# Patient Record
Sex: Female | Born: 1935 | Race: White | Hispanic: No
Health system: Southern US, Community
[De-identification: ages and names within clinical notes are randomized; demographics above are authoritative.]

---

## 2006-10-29 ENCOUNTER — Ambulatory Visit: Payer: Self-pay | Admitting: Ophthalmology

## 2010-01-03 ENCOUNTER — Ambulatory Visit: Payer: Self-pay | Admitting: Anesthesiology

## 2010-01-15 ENCOUNTER — Ambulatory Visit: Payer: Self-pay | Admitting: Anesthesiology

## 2010-02-03 ENCOUNTER — Ambulatory Visit: Payer: Self-pay | Admitting: Unknown Physician Specialty

## 2010-07-01 ENCOUNTER — Ambulatory Visit: Payer: Self-pay | Admitting: Oncology

## 2010-07-01 ENCOUNTER — Inpatient Hospital Stay: Payer: Self-pay | Admitting: Internal Medicine

## 2010-07-13 ENCOUNTER — Other Ambulatory Visit: Payer: Self-pay | Admitting: Unknown Physician Specialty

## 2010-08-13 ENCOUNTER — Ambulatory Visit: Payer: Self-pay | Admitting: Oncology

## 2010-08-14 ENCOUNTER — Ambulatory Visit: Payer: Self-pay | Admitting: Oncology

## 2010-08-31 ENCOUNTER — Ambulatory Visit: Payer: Self-pay | Admitting: Oncology

## 2010-09-13 ENCOUNTER — Observation Stay: Payer: Self-pay | Admitting: Specialist

## 2011-04-02 ENCOUNTER — Ambulatory Visit: Payer: Self-pay | Admitting: Internal Medicine

## 2011-04-14 ENCOUNTER — Emergency Department: Payer: Self-pay | Admitting: Internal Medicine

## 2011-04-14 LAB — URINALYSIS, COMPLETE
Bilirubin,UR: NEGATIVE
Blood: NEGATIVE
Glucose,UR: NEGATIVE mg/dL (ref 0–75)
Ketone: NEGATIVE
Specific Gravity: 1.014 (ref 1.003–1.030)
Squamous Epithelial: NONE SEEN
WBC UR: 30 /HPF (ref 0–5)

## 2011-04-14 LAB — COMPREHENSIVE METABOLIC PANEL
Albumin: 3.4 g/dL (ref 3.4–5.0)
Anion Gap: 9 (ref 7–16)
Bilirubin,Total: 0.3 mg/dL (ref 0.2–1.0)
Calcium, Total: 9.1 mg/dL (ref 8.5–10.1)
Chloride: 103 mmol/L (ref 98–107)
Creatinine: 0.86 mg/dL (ref 0.60–1.30)
Osmolality: 284 (ref 275–301)
SGOT(AST): 18 U/L (ref 15–37)
Total Protein: 7.7 g/dL (ref 6.4–8.2)

## 2011-04-14 LAB — TROPONIN I: Troponin-I: <0.02 ng/mL

## 2011-04-14 LAB — CBC
HCT: 32.5 % — ABNORMAL LOW (ref 35.0–47.0)
HGB: 10.9 g/dL — ABNORMAL LOW (ref 12.0–16.0)
MCH: 31.6 pg (ref 26.0–34.0)
MCHC: 33.4 g/dL (ref 32.0–36.0)
MCV: 95 fL (ref 80–100)
Platelet: 257 10*3/uL (ref 150–440)
WBC: 7.2 10*3/uL (ref 3.6–11.0)

## 2011-04-15 ENCOUNTER — Inpatient Hospital Stay: Payer: Self-pay | Admitting: Specialist

## 2011-04-15 LAB — CBC
HCT: 32.2 % — ABNORMAL LOW (ref 35.0–47.0)
HGB: 10.7 g/dL — ABNORMAL LOW (ref 12.0–16.0)
MCH: 31.7 pg (ref 26.0–34.0)
MCHC: 33.3 g/dL (ref 32.0–36.0)
RDW: 13.5 % (ref 11.5–14.5)
WBC: 17.4 10*3/uL — ABNORMAL HIGH (ref 3.6–11.0)

## 2011-04-15 LAB — COMPREHENSIVE METABOLIC PANEL
Anion Gap: 10 (ref 7–16)
BUN: 23 mg/dL — ABNORMAL HIGH (ref 7–18)
Bilirubin,Total: 0.3 mg/dL (ref 0.2–1.0)
Chloride: 100 mmol/L (ref 98–107)
Co2: 30 mmol/L (ref 21–32)
Creatinine: 0.91 mg/dL (ref 0.60–1.30)
Potassium: 4.4 mmol/L (ref 3.5–5.1)
SGOT(AST): 26 U/L (ref 15–37)
Total Protein: 7.9 g/dL (ref 6.4–8.2)

## 2011-04-15 LAB — PROTIME-INR
INR: 1
Prothrombin Time: 13.8 secs (ref 11.5–14.7)

## 2011-04-17 LAB — URINE CULTURE

## 2011-04-18 LAB — HEMATOCRIT: HCT: 26.1 % — ABNORMAL LOW (ref 35.0–47.0)

## 2011-04-18 LAB — ELECTROLYTE PANEL
Anion Gap: 13 (ref 7–16)
Chloride: 100 mmol/L (ref 98–107)
Co2: 25 mmol/L (ref 21–32)

## 2011-04-19 LAB — CREATININE, SERUM
EGFR (African American): >60
EGFR (Non-African Amer.): >60

## 2011-04-19 LAB — PATHOLOGY REPORT

## 2011-04-19 LAB — OCCULT BLOOD X 1 CARD TO LAB, STOOL: Occult Blood, Feces: NEGATIVE

## 2011-04-21 LAB — BASIC METABOLIC PANEL
Calcium, Total: 8.4 mg/dL — ABNORMAL LOW (ref 8.5–10.1)
EGFR (African American): >60
EGFR (Non-African Amer.): >60
Glucose: 101 mg/dL — ABNORMAL HIGH (ref 65–99)
Osmolality: 272 (ref 275–301)
Sodium: 136 mmol/L (ref 136–145)

## 2011-04-21 LAB — CBC WITH DIFFERENTIAL/PLATELET
Basophil #: 0.1 10*3/uL (ref 0.0–0.1)
Eosinophil #: 0.5 10*3/uL (ref 0.0–0.7)
HGB: 9 g/dL — ABNORMAL LOW (ref 12.0–16.0)
Lymphocyte %: 18.6 %
MCHC: 34.1 g/dL (ref 32.0–36.0)
Monocyte %: 14 %
Neutrophil %: 61.1 %
Platelet: 260 10*3/uL (ref 150–440)
RBC: 2.82 10*6/uL — ABNORMAL LOW (ref 3.80–5.20)
RDW: 14.2 % (ref 11.5–14.5)

## 2011-04-22 ENCOUNTER — Encounter: Payer: Self-pay | Admitting: Internal Medicine

## 2011-05-03 ENCOUNTER — Encounter: Payer: Self-pay | Admitting: Internal Medicine

## 2011-05-03 ENCOUNTER — Ambulatory Visit: Payer: Self-pay | Admitting: Internal Medicine

## 2011-05-31 ENCOUNTER — Encounter: Payer: Self-pay | Admitting: Internal Medicine

## 2011-07-01 ENCOUNTER — Encounter: Payer: Self-pay | Admitting: Internal Medicine

## 2011-07-31 ENCOUNTER — Encounter: Payer: Self-pay | Admitting: Internal Medicine

## 2011-12-07 ENCOUNTER — Inpatient Hospital Stay: Payer: Self-pay | Admitting: Surgery

## 2011-12-07 LAB — CBC
HCT: 37 % (ref 35.0–47.0)
HGB: 12.6 g/dL (ref 12.0–16.0)
MCH: 31.5 pg (ref 26.0–34.0)
MCHC: 34 g/dL (ref 32.0–36.0)
MCV: 93 fL (ref 80–100)
RDW: 13 % (ref 11.5–14.5)

## 2011-12-07 LAB — COMPREHENSIVE METABOLIC PANEL
Albumin: 3.3 g/dL — ABNORMAL LOW (ref 3.4–5.0)
BUN: 17 mg/dL (ref 7–18)
Bilirubin,Total: 0.8 mg/dL (ref 0.2–1.0)
Chloride: 98 mmol/L (ref 98–107)
Co2: 25 mmol/L (ref 21–32)
Creatinine: 0.84 mg/dL (ref 0.60–1.30)
EGFR (African American): >60
Glucose: 112 mg/dL — ABNORMAL HIGH (ref 65–99)
SGOT(AST): 33 U/L (ref 15–37)
SGPT (ALT): 32 U/L (ref 12–78)
Total Protein: 8.1 g/dL (ref 6.4–8.2)

## 2011-12-07 LAB — URINALYSIS, COMPLETE
Bacteria: NONE SEEN
Bilirubin,UR: NEGATIVE
Glucose,UR: NEGATIVE mg/dL (ref 0–75)
Nitrite: NEGATIVE
Protein: NEGATIVE
Specific Gravity: 1.021 (ref 1.003–1.030)
WBC UR: 28 /HPF (ref 0–5)

## 2011-12-07 LAB — TROPONIN I: Troponin-I: <0.02 ng/mL

## 2011-12-08 LAB — BASIC METABOLIC PANEL
Calcium, Total: 8.2 mg/dL — ABNORMAL LOW (ref 8.5–10.1)
Chloride: 101 mmol/L (ref 98–107)
Co2: 26 mmol/L (ref 21–32)
Creatinine: 0.74 mg/dL (ref 0.60–1.30)
EGFR (African American): >60
Glucose: 105 mg/dL — ABNORMAL HIGH (ref 65–99)
Potassium: 4.4 mmol/L (ref 3.5–5.1)
Sodium: 134 mmol/L — ABNORMAL LOW (ref 136–145)

## 2011-12-08 LAB — CBC WITH DIFFERENTIAL/PLATELET
Basophil %: 1.3 %
Eosinophil #: 0.2 10*3/uL (ref 0.0–0.7)
Eosinophil %: 2.5 %
HCT: 32.2 % — ABNORMAL LOW (ref 35.0–47.0)
HGB: 10.7 g/dL — ABNORMAL LOW (ref 12.0–16.0)
Lymphocyte #: 1.9 10*3/uL (ref 1.0–3.6)
Lymphocyte %: 23.3 %
MCH: 30.9 pg (ref 26.0–34.0)
MCV: 93 fL (ref 80–100)
Monocyte %: 15.8 %
Neutrophil #: 4.8 10*3/uL (ref 1.4–6.5)
RBC: 3.45 10*6/uL — ABNORMAL LOW (ref 3.80–5.20)
WBC: 8.3 10*3/uL (ref 3.6–11.0)

## 2011-12-09 LAB — BASIC METABOLIC PANEL
Anion Gap: 9 (ref 7–16)
Calcium, Total: 8.3 mg/dL — ABNORMAL LOW (ref 8.5–10.1)
Chloride: 101 mmol/L (ref 98–107)
Co2: 23 mmol/L (ref 21–32)
Creatinine: 0.68 mg/dL (ref 0.60–1.30)
EGFR (African American): >60
EGFR (Non-African Amer.): >60
Glucose: 159 mg/dL — ABNORMAL HIGH (ref 65–99)
Osmolality: 271 (ref 275–301)
Potassium: 4.6 mmol/L (ref 3.5–5.1)
Sodium: 133 mmol/L — ABNORMAL LOW (ref 136–145)

## 2011-12-09 LAB — CLOSTRIDIUM DIFFICILE BY PCR

## 2011-12-09 LAB — CBC WITH DIFFERENTIAL/PLATELET
Eosinophil #: 0 10*3/uL (ref 0.0–0.7)
Eosinophil %: 0 %
HCT: 33.4 % — ABNORMAL LOW (ref 35.0–47.0)
Lymphocyte %: 3.9 %
MCV: 94 fL (ref 80–100)
Monocyte %: 6.5 %
Neutrophil #: 14.9 10*3/uL — ABNORMAL HIGH (ref 1.4–6.5)
Neutrophil %: 89.5 %
Platelet: 312 10*3/uL (ref 150–440)
RBC: 3.54 10*6/uL — ABNORMAL LOW (ref 3.80–5.20)
RDW: 13.4 % (ref 11.5–14.5)
WBC: 16.7 10*3/uL — ABNORMAL HIGH (ref 3.6–11.0)

## 2011-12-10 LAB — BASIC METABOLIC PANEL
Calcium, Total: 7.9 mg/dL — ABNORMAL LOW (ref 8.5–10.1)
Creatinine: 1.02 mg/dL (ref 0.60–1.30)
Glucose: 143 mg/dL — ABNORMAL HIGH (ref 65–99)
Osmolality: 284 (ref 275–301)
Potassium: 4.2 mmol/L (ref 3.5–5.1)
Sodium: 140 mmol/L (ref 136–145)

## 2011-12-10 LAB — CBC WITH DIFFERENTIAL/PLATELET
Basophil #: 0 10*3/uL (ref 0.0–0.1)
Basophil %: 0.2 %
Eosinophil #: 0.1 10*3/uL (ref 0.0–0.7)
Eosinophil %: 0.5 %
HGB: 8.2 g/dL — ABNORMAL LOW (ref 12.0–16.0)
Lymphocyte #: 1.1 10*3/uL (ref 1.0–3.6)
MCV: 93 fL (ref 80–100)
Monocyte %: 11.7 %
Neutrophil %: 76.1 %
Platelet: 237 10*3/uL (ref 150–440)
RBC: 2.58 10*6/uL — ABNORMAL LOW (ref 3.80–5.20)
RDW: 13.6 % (ref 11.5–14.5)
WBC: 9.4 10*3/uL (ref 3.6–11.0)

## 2011-12-11 LAB — CBC WITH DIFFERENTIAL/PLATELET
Basophil %: 0.6 %
Eosinophil %: 2.2 %
HCT: 25.3 % — ABNORMAL LOW (ref 35.0–47.0)
HGB: 8.4 g/dL — ABNORMAL LOW (ref 12.0–16.0)
Lymphocyte #: 1.3 10*3/uL (ref 1.0–3.6)
Lymphocyte %: 15.6 %
MCV: 94 fL (ref 80–100)
Monocyte %: 7.5 %
Platelet: 275 10*3/uL (ref 150–440)
RBC: 2.68 10*6/uL — ABNORMAL LOW (ref 3.80–5.20)
WBC: 8.4 10*3/uL (ref 3.6–11.0)

## 2011-12-12 LAB — BASIC METABOLIC PANEL
BUN: 6 mg/dL — ABNORMAL LOW (ref 7–18)
Calcium, Total: 8.3 mg/dL — ABNORMAL LOW (ref 8.5–10.1)
Co2: 25 mmol/L (ref 21–32)
Creatinine: 0.52 mg/dL — ABNORMAL LOW (ref 0.60–1.30)
Glucose: 124 mg/dL — ABNORMAL HIGH (ref 65–99)

## 2012-01-03 ENCOUNTER — Emergency Department: Payer: Self-pay | Admitting: Unknown Physician Specialty

## 2012-01-03 LAB — URINALYSIS, COMPLETE
Bacteria: NONE SEEN
Bilirubin,UR: NEGATIVE
Blood: NEGATIVE
Glucose,UR: NEGATIVE mg/dL (ref 0–75)
Ketone: NEGATIVE
Leukocyte Esterase: NEGATIVE
Ph: 6 (ref 4.5–8.0)
Specific Gravity: 1.005 (ref 1.003–1.030)
Squamous Epithelial: 1

## 2012-01-03 LAB — COMPREHENSIVE METABOLIC PANEL
Albumin: 3 g/dL — ABNORMAL LOW (ref 3.4–5.0)
Alkaline Phosphatase: 101 U/L (ref 50–136)
Anion Gap: 9 (ref 7–16)
BUN: 16 mg/dL (ref 7–18)
Calcium, Total: 9.2 mg/dL (ref 8.5–10.1)
EGFR (African American): >60
Glucose: 78 mg/dL (ref 65–99)
Osmolality: 276 (ref 275–301)
Potassium: 4.6 mmol/L (ref 3.5–5.1)
SGOT(AST): 28 U/L (ref 15–37)
SGPT (ALT): 21 U/L (ref 12–78)
Sodium: 138 mmol/L (ref 136–145)
Total Protein: 7.4 g/dL (ref 6.4–8.2)

## 2012-01-03 LAB — CBC WITH DIFFERENTIAL/PLATELET
Basophil #: 0.2 10*3/uL — ABNORMAL HIGH (ref 0.0–0.1)
Eosinophil %: 6.2 %
HGB: 10.7 g/dL — ABNORMAL LOW (ref 12.0–16.0)
MCV: 92 fL (ref 80–100)
Monocyte %: 11 %
Neutrophil %: 52.2 %
Platelet: 322 10*3/uL (ref 150–440)
RBC: 3.44 10*6/uL — ABNORMAL LOW (ref 3.80–5.20)
RDW: 13.2 % (ref 11.5–14.5)
WBC: 7.2 10*3/uL (ref 3.6–11.0)

## 2012-01-03 LAB — LIPASE, BLOOD: Lipase: 66 U/L — ABNORMAL LOW (ref 73–393)

## 2012-01-03 LAB — TROPONIN I: Troponin-I: <0.02 ng/mL

## 2012-07-09 ENCOUNTER — Emergency Department: Payer: Self-pay | Admitting: Emergency Medicine

## 2012-07-09 LAB — CBC WITH DIFFERENTIAL/PLATELET
Basophil %: 0.7 %
Eosinophil #: 0 10*3/uL (ref 0.0–0.7)
HGB: 12.9 g/dL (ref 12.0–16.0)
Lymphocyte %: 24.1 %
MCH: 30.9 pg (ref 26.0–34.0)
MCV: 92 fL (ref 80–100)
Monocyte #: 0.6 x10 3/mm (ref 0.2–0.9)
Monocyte %: 5.7 %
Neutrophil #: 6.9 10*3/uL — ABNORMAL HIGH (ref 1.4–6.5)
Neutrophil %: 69.1 %
RDW: 13.1 % (ref 11.5–14.5)
WBC: 9.9 10*3/uL (ref 3.6–11.0)

## 2012-07-09 LAB — COMPREHENSIVE METABOLIC PANEL
Alkaline Phosphatase: 105 U/L (ref 50–136)
Anion Gap: 8 (ref 7–16)
Bilirubin,Total: 0.7 mg/dL (ref 0.2–1.0)
Calcium, Total: 9.2 mg/dL (ref 8.5–10.1)
Chloride: 98 mmol/L (ref 98–107)
Co2: 27 mmol/L (ref 21–32)
Creatinine: 0.58 mg/dL — ABNORMAL LOW (ref 0.60–1.30)
EGFR (African American): >60
EGFR (Non-African Amer.): >60
Glucose: 135 mg/dL — ABNORMAL HIGH (ref 65–99)
Osmolality: 270 (ref 275–301)
Potassium: 3.6 mmol/L (ref 3.5–5.1)
SGOT(AST): 26 U/L (ref 15–37)

## 2012-07-09 LAB — TROPONIN I: Troponin-I: <0.02 ng/mL

## 2012-07-09 LAB — CK TOTAL AND CKMB (NOT AT ARMC)
CK, Total: 98 U/L (ref 21–215)
CK-MB: 0.8 ng/mL (ref 0.5–3.6)

## 2012-07-13 ENCOUNTER — Emergency Department: Payer: Self-pay | Admitting: Emergency Medicine

## 2012-07-13 LAB — CBC
HCT: 36.4 % (ref 35.0–47.0)
HGB: 12.2 g/dL (ref 12.0–16.0)
MCH: 31.2 pg (ref 26.0–34.0)
MCHC: 33.4 g/dL (ref 32.0–36.0)
MCV: 93 fL (ref 80–100)
Platelet: 282 10*3/uL (ref 150–440)
RBC: 3.9 10*6/uL (ref 3.80–5.20)
RDW: 13.5 % (ref 11.5–14.5)
WBC: 9.2 10*3/uL (ref 3.6–11.0)

## 2012-07-13 LAB — COMPREHENSIVE METABOLIC PANEL
Anion Gap: 4 — ABNORMAL LOW (ref 7–16)
Bilirubin,Total: 0.3 mg/dL (ref 0.2–1.0)
Chloride: 99 mmol/L (ref 98–107)
Creatinine: 0.65 mg/dL (ref 0.60–1.30)
EGFR (African American): >60
EGFR (Non-African Amer.): >60
Glucose: 125 mg/dL — ABNORMAL HIGH (ref 65–99)
Osmolality: 269 (ref 275–301)
SGPT (ALT): 19 U/L (ref 12–78)
Total Protein: 8.1 g/dL (ref 6.4–8.2)

## 2012-07-13 LAB — URINALYSIS, COMPLETE
Bilirubin,UR: NEGATIVE
Blood: NEGATIVE
Ketone: NEGATIVE
Nitrite: NEGATIVE
RBC,UR: 3 /HPF (ref 0–5)
Specific Gravity: 1.014 (ref 1.003–1.030)
Squamous Epithelial: NONE SEEN
WBC UR: 3 /HPF (ref 0–5)

## 2012-09-15 ENCOUNTER — Observation Stay: Payer: Self-pay | Admitting: Internal Medicine

## 2012-09-15 LAB — TROPONIN I: Troponin-I: 0.03 ng/mL

## 2012-09-15 LAB — CBC
MCH: 31.5 pg (ref 26.0–34.0)
MCV: 91 fL (ref 80–100)
WBC: 10.2 10*3/uL (ref 3.6–11.0)

## 2012-09-15 LAB — URINALYSIS, COMPLETE
Bilirubin,UR: NEGATIVE
Glucose,UR: NEGATIVE mg/dL (ref 0–75)
Ketone: NEGATIVE
Nitrite: NEGATIVE
Ph: 5 (ref 4.5–8.0)
RBC,UR: 8 /HPF (ref 0–5)
Specific Gravity: 1.012 (ref 1.003–1.030)
Squamous Epithelial: 1
WBC UR: 5 /HPF (ref 0–5)

## 2012-09-15 LAB — COMPREHENSIVE METABOLIC PANEL
Alkaline Phosphatase: 118 U/L (ref 50–136)
Anion Gap: 7 (ref 7–16)
BUN: 12 mg/dL (ref 7–18)
Bilirubin,Total: 0.4 mg/dL (ref 0.2–1.0)
Co2: 27 mmol/L (ref 21–32)
Creatinine: 0.65 mg/dL (ref 0.60–1.30)
EGFR (African American): >60
EGFR (Non-African Amer.): >60
Glucose: 126 mg/dL — ABNORMAL HIGH (ref 65–99)
Osmolality: 258 (ref 275–301)
SGPT (ALT): 22 U/L (ref 12–78)
Sodium: 128 mmol/L — ABNORMAL LOW (ref 136–145)

## 2012-09-15 LAB — CK TOTAL AND CKMB (NOT AT ARMC)
CK, Total: 97 U/L (ref 21–215)
CK-MB: 1.3 ng/mL (ref 0.5–3.6)

## 2012-09-16 LAB — BASIC METABOLIC PANEL
Anion Gap: 7 (ref 7–16)
Calcium, Total: 8.6 mg/dL (ref 8.5–10.1)
Creatinine: 0.6 mg/dL (ref 0.60–1.30)
EGFR (African American): >60
EGFR (Non-African Amer.): >60
Glucose: 97 mg/dL (ref 65–99)
Sodium: 133 mmol/L — ABNORMAL LOW (ref 136–145)

## 2012-09-17 LAB — BASIC METABOLIC PANEL
Anion Gap: 5 — ABNORMAL LOW (ref 7–16)
Calcium, Total: 9.1 mg/dL (ref 8.5–10.1)
Creatinine: 0.83 mg/dL (ref 0.60–1.30)
Glucose: 85 mg/dL (ref 65–99)
Osmolality: 261 (ref 275–301)

## 2012-09-18 LAB — URINE CULTURE

## 2012-09-28 ENCOUNTER — Ambulatory Visit: Payer: Self-pay | Admitting: Family Medicine

## 2012-10-26 ENCOUNTER — Ambulatory Visit: Payer: Self-pay | Admitting: Family Medicine

## 2013-01-27 IMAGING — CT CT HEAD WITHOUT CONTRAST
1 of 4 series · 10 of 30 positions shown, 13 images · non-contrast
Comparison: none

REASON FOR EXAM: fall
COMMENTS:

PROCEDURE:     CT  - CT HEAD WITHOUT CONTRAST  - April 16, 2011  [DATE]
RESULT:     Comparison is made to prior study dated 04/14/2011.
TECHNIQUE: Helical noncontrasted 5 mm sections were obtained from the skull
base to the vertex.

[Series 2: without · axial · non-contrast · 0.39mm/px · z∈[+261,+381]mm · 10 of 30 slices shown, 13 images]
[im 3/30  brain]
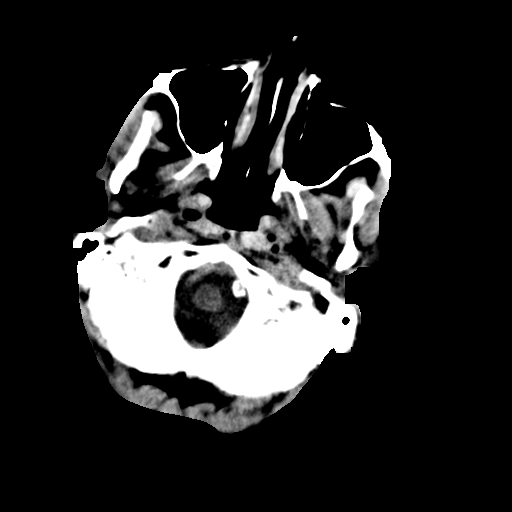
[im 3/30  bone]
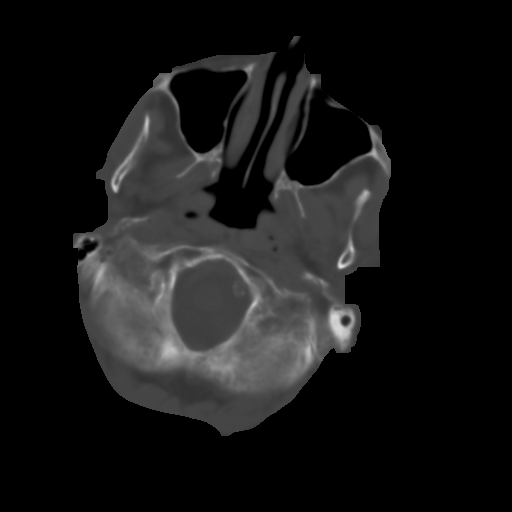
[im 6/30  brain]
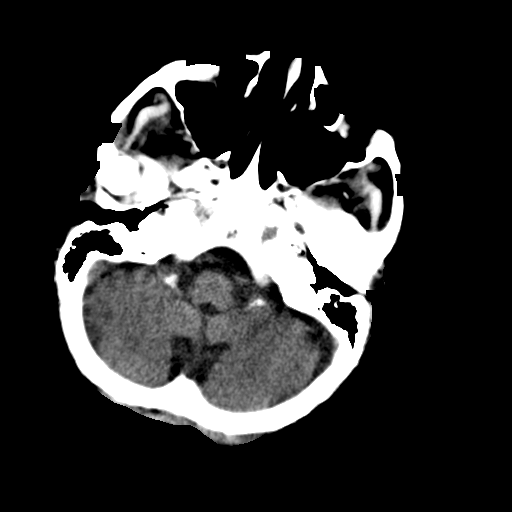
[im 8/30  brain]
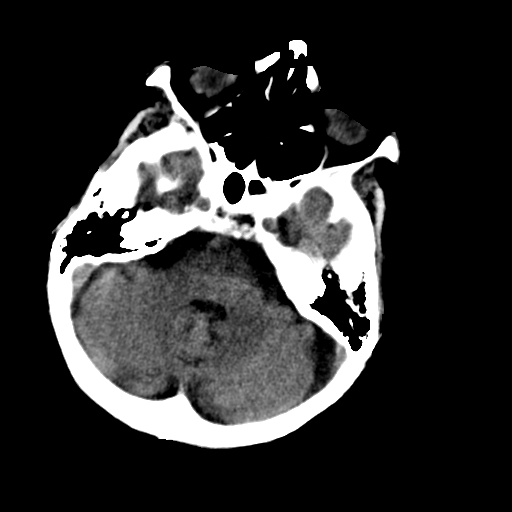
[im 11/30  brain]
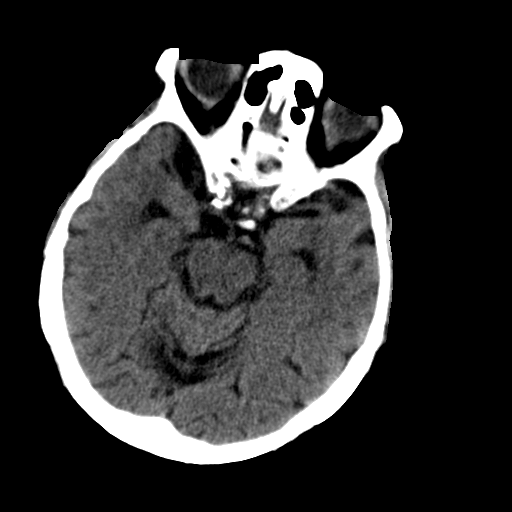
[im 14/30  brain]
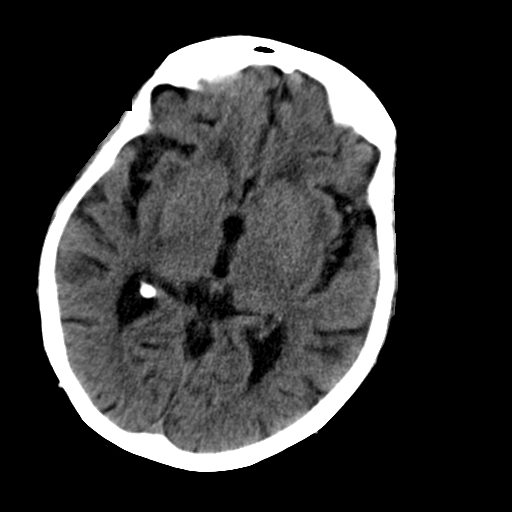
[im 14/30  bone]
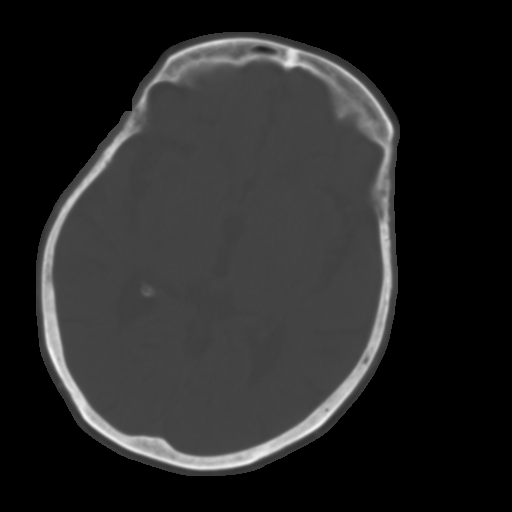
[im 16/30  brain]
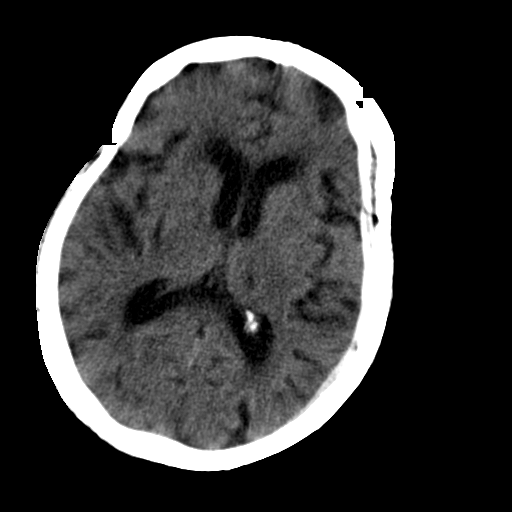
[im 19/30  brain]
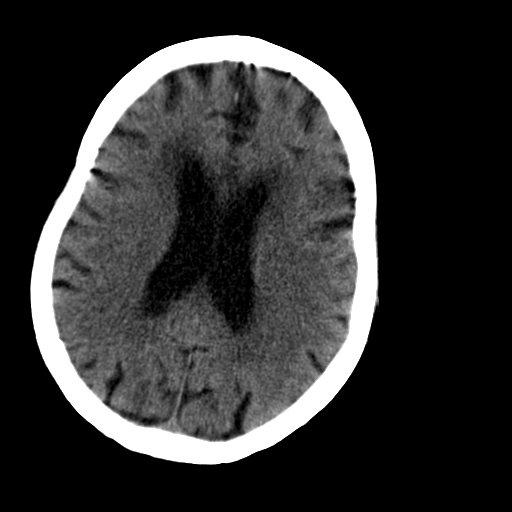
[im 22/30  brain]
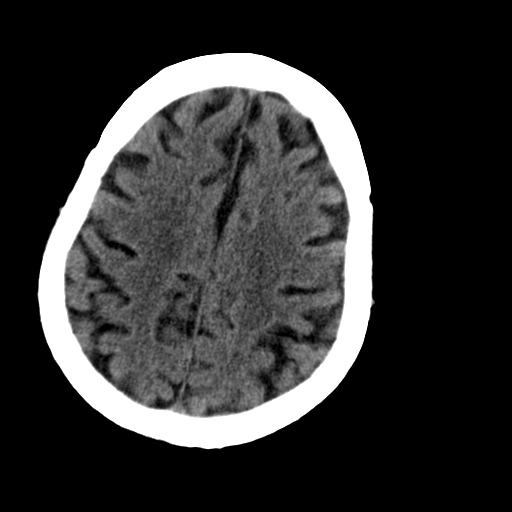
[im 24/30  brain]
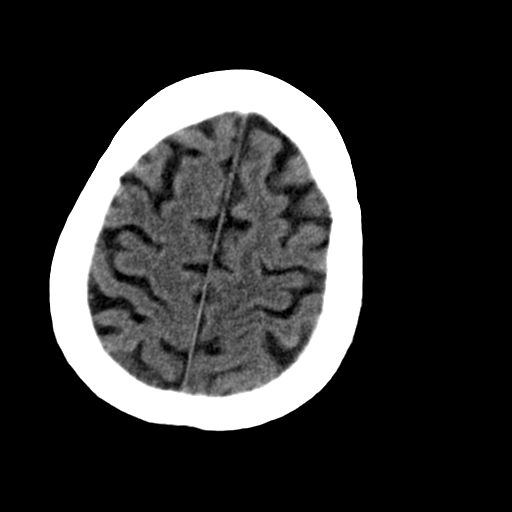
[im 24/30  bone]
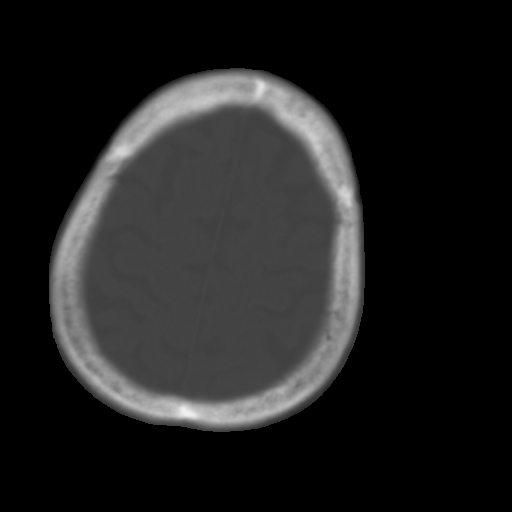
[im 27/30  brain]
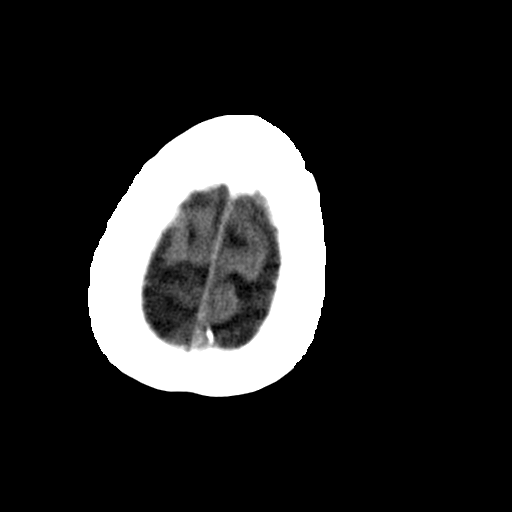

[10 of 30 positions shown; findings below may reference images not displayed]

FINDINGS: There is diffuse cortical atrophy. Areas of low attenuation
project in the subcortical, deep, and periventricular white matter regions.
There is no evidence of mass effect. There is no evidence of intra-axial nor
extra-axial fluid collections nor evidence of acute hemorrhage. The
ventricles and cisterns are patent. There is no evidence of a depressed
skull fracture.
IMPRESSION: Involutional changes as described above.

## 2013-11-30 ENCOUNTER — Ambulatory Visit: Payer: Self-pay | Admitting: Internal Medicine

## 2013-12-28 LAB — URINALYSIS, COMPLETE
BACTERIA: NONE SEEN
BLOOD: NEGATIVE
Bilirubin,UR: NEGATIVE
Glucose,UR: NEGATIVE mg/dL (ref 0–75)
LEUKOCYTE ESTERASE: NEGATIVE
NITRITE: NEGATIVE
Ph: 6 (ref 4.5–8.0)
Protein: NEGATIVE
RBC,UR: 5 /HPF (ref 0–5)
SPECIFIC GRAVITY: 1.014 (ref 1.003–1.030)
Squamous Epithelial: <1
WBC UR: 1 /HPF (ref 0–5)

## 2013-12-28 LAB — CBC WITH DIFFERENTIAL/PLATELET
BASOS ABS: 0.1 10*3/uL (ref 0.0–0.1)
BASOS PCT: 1 %
Eosinophil #: 0.1 10*3/uL (ref 0.0–0.7)
Eosinophil %: 1 %
HCT: 37.8 % (ref 35.0–47.0)
HGB: 12.3 g/dL (ref 12.0–16.0)
Lymphocyte #: 1.2 10*3/uL (ref 1.0–3.6)
Lymphocyte %: 11.1 %
MCH: 29.6 pg (ref 26.0–34.0)
MCHC: 32.4 g/dL (ref 32.0–36.0)
MCV: 91 fL (ref 80–100)
MONO ABS: 1.2 x10 3/mm — AB (ref 0.2–0.9)
Monocyte %: 11.2 %
NEUTROS PCT: 75.7 %
Neutrophil #: 7.9 10*3/uL — ABNORMAL HIGH (ref 1.4–6.5)
Platelet: 289 10*3/uL (ref 150–440)
RBC: 4.14 10*6/uL (ref 3.80–5.20)
RDW: 13.9 % (ref 11.5–14.5)
WBC: 10.5 10*3/uL (ref 3.6–11.0)

## 2013-12-28 LAB — COMPREHENSIVE METABOLIC PANEL
ALBUMIN: 3.1 g/dL — AB (ref 3.4–5.0)
ANION GAP: 7 (ref 7–16)
AST: 31 U/L (ref 15–37)
Alkaline Phosphatase: 128 U/L — ABNORMAL HIGH
BUN: 13 mg/dL (ref 7–18)
Bilirubin,Total: 0.6 mg/dL (ref 0.2–1.0)
CHLORIDE: 95 mmol/L — AB (ref 98–107)
Calcium, Total: 8.9 mg/dL (ref 8.5–10.1)
Co2: 27 mmol/L (ref 21–32)
Creatinine: 0.69 mg/dL (ref 0.60–1.30)
EGFR (African American): >60
Glucose: 126 mg/dL — ABNORMAL HIGH (ref 65–99)
OSMOLALITY: 261 (ref 275–301)
POTASSIUM: 4.1 mmol/L (ref 3.5–5.1)
SGPT (ALT): 33 U/L
Sodium: 129 mmol/L — ABNORMAL LOW (ref 136–145)
Total Protein: 7.9 g/dL (ref 6.4–8.2)

## 2013-12-28 LAB — MAGNESIUM: Magnesium: 1.3 mg/dL — ABNORMAL LOW

## 2013-12-28 LAB — PROTIME-INR
INR: 1.1
Prothrombin Time: 14.4 secs (ref 11.5–14.7)

## 2013-12-28 LAB — PHOSPHORUS: Phosphorus: 2.7 mg/dL (ref 2.5–4.9)

## 2013-12-28 LAB — TROPONIN I

## 2013-12-29 ENCOUNTER — Inpatient Hospital Stay: Payer: Self-pay | Admitting: Internal Medicine

## 2013-12-29 LAB — ALBUMIN: Albumin: 2.6 g/dL — ABNORMAL LOW (ref 3.4–5.0)

## 2013-12-29 LAB — TSH: Thyroid Stimulating Horm: 1.6 u[IU]/mL

## 2013-12-30 ENCOUNTER — Ambulatory Visit: Payer: Self-pay | Admitting: Internal Medicine

## 2013-12-30 LAB — BASIC METABOLIC PANEL
Anion Gap: 5 — ABNORMAL LOW (ref 7–16)
BUN: 7 mg/dL (ref 7–18)
Calcium, Total: 7.8 mg/dL — ABNORMAL LOW (ref 8.5–10.1)
Chloride: 104 mmol/L (ref 98–107)
Co2: 25 mmol/L (ref 21–32)
Creatinine: 0.53 mg/dL — ABNORMAL LOW (ref 0.60–1.30)
EGFR (African American): >60
Glucose: 82 mg/dL (ref 65–99)
Osmolality: 265 (ref 275–301)
POTASSIUM: 4 mmol/L (ref 3.5–5.1)
SODIUM: 134 mmol/L — AB (ref 136–145)

## 2013-12-30 LAB — URINE CULTURE

## 2014-01-02 LAB — CULTURE, BLOOD (SINGLE)

## 2014-01-30 ENCOUNTER — Ambulatory Visit: Payer: Self-pay | Admitting: Internal Medicine

## 2014-05-31 DEATH — deceased

## 2014-07-19 NOTE — Consult Note (Signed)
CC: bowel obst.  Pt had surgery yesterday.  Awake and alert.  Chest with good air flow on deep breath. NG draining.  Path pending.  No new recommendations.  Electronic Signatures: Scot JunElliott, Robert T (MD)  (Signed on 09-Sep-13 19:08)  Authored  Last Updated: 09-Sep-13 19:08 by Scot JunElliott, Robert T (MD)

## 2014-07-19 NOTE — Consult Note (Signed)
CC: large bowel obst.  Pt doing well, going to rehab.  eating well, ostomoy functioning well.  Electronic Signatures: Scot JunElliott, Ramey Ketcherside T (MD)  (Signed on 13-Sep-13 13:11)  Authored  Last Updated: 13-Sep-13 13:11 by Scot JunElliott, Zakyla Tonche T (MD)

## 2014-07-19 NOTE — Consult Note (Signed)
Pt had a twist in rectosigmoid area then an impassable turn in the sigmoid area with multipe diverticuli present and fair visability due to fleets enema prep.  Will repeat CT this time with oral contrast.  Dr Egbert GaribaldiBird notified.  Electronic Signatures: Scot JunElliott, Lynleigh Kovack T (MD)  (Signed on 07-Sep-13 19:12)  Authored  Last Updated: 07-Sep-13 19:12 by Scot JunElliott, Hope Holst T (MD)

## 2014-07-19 NOTE — Consult Note (Signed)
Brief Consult Note: Diagnosis: abd distension and vomiting.   Patient was seen by consultant.   Consult note dictated.   Recommend further assessment or treatment.   Discussed with Attending MD.   Comments: doubt volvulus but needs sigmoidoscopy, may be ileus due to UTI\ will follow.  Electronic Signatures: Lattie Hawooper, Korra Christine E (MD)  (Signed 07-Sep-13 15:36)  Authored: Brief Consult Note   Last Updated: 07-Sep-13 15:36 by Lattie Hawooper, Jaylnn Ullery E (MD)

## 2014-07-19 NOTE — Op Note (Signed)
PATIENT NAME:  Rhonda Webb, BOMKAMP MR#:  [id number] DATE OF BIRTH:  17-Sep-1912  DATE OF PROCEDURE:  12/08/2011  PREOPERATIVE DIAGNOSIS: Large bowel obstruction.   POSTOPERATIVE DIAGNOSIS: Distal rectal obstructing mass.   PROCEDURE: Hartmann's procedure with end colostomy.   SURGEON: Dionne Milo, M.D.   ANESTHESIA: General with endotracheal tube.   INDICATIONS: This is a patient with a large bowel obstruction requiring exploration and possible diversion or resection. Preoperatively we discussed the rationale for surgery, the options of observation, risk of bleeding, infection, recurrence of disease, colostomy formation, anastomosis or anastomotic leak. This was all reviewed for her and her family multiple times preoperatively. They understood and agreed to proceed.   FINDINGS: Extrinsic mass of the distal rectum very low in the pelvis, not mucosal-based, with near obstruction due to extrinsic compression. Proximal to this the colon was greatly dilated all the way to the cecum measuring up to 15 cm.   No abnormalities of the liver or stomach or the remainder of the colon were noted.   DESCRIPTION OF PROCEDURE: The patient was induced to general anesthesia. She was on IV antibiotics. VTE prophylaxis was in place. She was prepped and draped in a sterile fashion. A midline incision was utilized to open and explore the abdominal cavity adhesions were taken down sharply and a single partial thickness serosal tear to the small bowel was repaired with Lembert type silk sutures of 3-0 size.  No luminal rent occurred.  Palpation of the greatly dilated colon and rectum was performed down into the pelvis. The abdominal cavity was explored and ultimately the palpable mass, which was somewhat soft in nature in the distal rectum was identified. Avascular line of Toldt was taken down laterally and extended down into the pelvis. The left ureter was identified and kept in view at all times. Further dissection  allowed for elevation of the colon. However, this mass was very deep in the pelvis and a TA-60 was fired just distal to it, but it was difficult to get a significant distal margin due to the depth of this within the pelvis. The specimen was elevated and the mesentery was divided between clamps and ligated with 0 silk ligatures. Then a GIA was fired across the proximal portion of the colon and the specimen passed off for examination on the back table.   On the back table it was opened and there was no visible mucosal abnormality. The mass appeared to be extrinsic to it within the mesentery, much like a diverticular scar, but the lumen itself was very tight.  No other abnormalities were noted within the abdomen. Hemostasis was with electrocautery and maintained at all times. The pelvis was irrigated with copious amounts of normal saline. There was no sign of bleeding or bowel injury. Therefore, a colostomy site was chosen and a circular portion of skin was excised. Cruciate incision in the rectus sheath was performed and the proximal colon was passed out through the rent that had just been made.   The wound was again inspected for hemostasis and found to be adequate. Sponge, lap, and needle counts were correct at this point. Therefore the wound was closed with running #1 PDS. Marcaine was infiltrated in the skin and subcutaneous tissues irrigated. The area was irrigated in the subcutaneous tissues as well and then skin staples were placed and a sterile dressing was placed.   Maturation of the stoma was performed in the standard fashion utilizing 3-0 Vicryl and an ostomy appliance was placed.   The  patient tolerated this procedure well. There were no complications. She was taken to the recovery room in stable condition to be admitted for continued care. Sponge, lap, and needle counts were correct. The estimated blood loss was 150 to 200 mL.  ____________________________ Adah Salvageichard E. Excell Seltzerooper,  MD rec:bjt D: 12/08/2011 21:13:08 ET T: 12/09/2011 10:38:25 ET JOB#: [id number]  cc: Adah Salvageichard E. Excell Seltzerooper, MD, <Dictator> Lattie HawICHARD E Hildred Pharo MD ELECTRONICALLY SIGNED 12/11/2011 13:07

## 2014-07-19 NOTE — Discharge Summary (Signed)
PATIENT NAME:  Rhonda Webb, [name] MR#:  [id number] DATE OF BIRTH:  [birth year]  DATE OF ADMISSION:  12/07/2011 DATE OF DISCHARGE:  12/13/2011  DISCHARGE DIAGNOSES:  1. Perforated diverticulitis causing a large bowel obstruction.  2. Reflux disease.  3. Hypertension.  4. Osteoporosis.  5. Glaucoma.  6. History of hip fracture replacement.  PROCEDURE PERFORMED: Sigmoid colectomy with end colostomy and Hartmann's pouch. 12/08/2011   DISCHARGE MEDICATIONS:  1. Protonix 40 mg p.o. daily. 2. Fexofenadine 180 mg p.o. daily.  3. Losartan 25 mg p.o. daily. 4. Vitamin D, 2 tabs p.o. daily.  5. Metoprolol 12.5 p.o. every 12 hours.  6. Tramadol 50 mg p.o. every 6 hours p.r.n. pain.   CONDITION ON DISCHARGE: At the time of discharge, [name] was having good ostomy output, was voiding and stooling without difficulties, was participating in PT and OT and was tolerating p.o.   HOSPITAL COURSE: [name] is a 81 year old female who comes in with large bowel obstruction. She underwent sigmoid colectomy with end colostomy following her surgery. Her ostomy worked very quickly, and she progressed. Immediately postoperatively she had and NG tube and was given IVF and a PCA for pain. Her diet was advanced from clear liquids to regular. Her fluids were stopped. She was given tramadol for pain when tolerating PO.   DISCHARGE INSTRUCTIONS:  1. She is to follow up Dr. Excell Seltzerooper in approximately one week.  2. She is to call or return to the ED if she develops fever greater than 101.5, increased pain, nausea, vomiting, or redness and drainage from incision. ____________________________ Si Raiderhristopher A. Lajuan Kovaleski, MD cal:cbb D: 12/13/2011 09:24:37 ET T: 12/13/2011 09:35:09 ET JOB#: [id number]  cc: Cristal Deerhristopher A. Cicily Bonano, MD, <Dictator> Jarvis NewcomerHRISTOPHER A Breyona Swander MD ELECTRONICALLY SIGNED 12/13/2011 16:30

## 2014-07-19 NOTE — Consult Note (Signed)
CC: hi grade sigmoid obstruction on BE.  Unable to pass this area with a small upper endo scope.  I told the patient and family that it was either surgery or nothing.  I think given her successful hip surgery in Janurary that she is a reasonable candidate for the surgery even though she is 81 years old.  I expressed great confidence in Dr. Excell Seltzerooper to the family and they plan to proceed with surgery today. Case discussed with Dr. Swazil[place]. Also I will discuss with Dr. Excell Seltzerooper when he finishes his current surgery.  Electronic Signatures: Scot JunElliott, Symone Cornman T (MD)  (Signed on 08-Sep-13 10:40)  Authored  Last Updated: 08-Sep-13 10:40 by Scot JunElliott, Kristyna Bradstreet T (MD)

## 2014-07-19 NOTE — Consult Note (Signed)
CC: colon obstruction.  Pt starting to stool into bag.  NG pulled by nurse today.  Pt awake, responds appropriately to requests.  Chest clear ant field.WBC 9.4, hgb 8, creat 1, BP 92/50, P 82, T 98.2.  Doing well after surgery, esp given her age.  Electronic Signatures: Scot JunElliott, Robert T (MD)  (Signed on 10-Sep-13 11:39)  Authored  Last Updated: 10-Sep-13 11:39 by Scot JunElliott, Robert T (MD)

## 2014-07-19 NOTE — Consult Note (Signed)
CC: abd mass with exploration and resection.  Path came back with extensive diverticulitis and extrinsic mass effect, no malignancy.  Family and patient are very happy with outcome. She has some bowel sounds today, VSS afebrile. No new suggestions.  Electronic Signatures: Scot JunElliott, Robert T (MD)  (Signed on 11-Sep-13 17:08)  Authored  Last Updated: 11-Sep-13 17:08 by Scot JunElliott, Robert T (MD)

## 2014-07-19 NOTE — Consult Note (Signed)
PATIENT NAME:  Rhonda Webb, Rhonda G MR#:  Webb DATE OF BIRTH:  [birth year]  DATE OF CONSULTATION:  12/07/2011  CONSULTING PHYSICIAN:  Adah Salvageichard E. Excell Seltzerooper, MD  CHIEF COMPLAINT: Abdominal pain.   HISTORY OF PRESENT ILLNESS: The patient is a 81 year old female patient who presents with nausea, vomiting for the last day; but she has actually been having pain in her left lower quadrant for approximately 5 to 7 days. She has never had an episode like this before. She states that she is passing gas but has not had a bowel movement. A work-up in the Emergency Room suggested possible volvulus, and I was asked to see the patient for that. GI has already been consulted, and she is to be admitted to the Internal Medicine Service.   PAST MEDICAL HISTORY:  1. Reflux disease.  2. Hypertension.  3. Osteoporosis.  4. Glaucoma. 5. Hip fracture repaired recently.   PAST SURGICAL HISTORY: Hip fracture repair. Denies abdominal surgery.   SOCIAL HISTORY: The patient lives in assisted living. She does not smoke or drink. She is accompanied by family members.   FAMILY HISTORY: Noncontributory.   ALLERGIES: Aspirin.   MEDICATIONS: Multiple, see chart.   REVIEW OF SYSTEMS: A 10-system review was attempted and for the most part performed with some difficulty, but her family assisted as well.   PHYSICAL EXAMINATION:  GENERAL: Afebrile, the patient appears quite comfortable.   VITAL SIGNS: Stable.   HEENT: No scleral icterus.   NECK: No palpable neck nodes.   CHEST: Clear to auscultation.   CARDIAC: Regular rate and rhythm.   ABDOMEN: Distended and tympanitic, nontender. No percussion or rebound tenderness. No guarding.   EXTREMITIES: Without edema. Calves are nontender.   NEUROLOGIC: Grossly intact.   INTEGUMENT: No jaundice.   LABORATORY, DIAGNOSTIC, AND RADIOLOGICAL DATA: Laboratory values demonstrate a creatinine of 0.84, sodium of 130, lipase of 47. White blood cell count is 10.4, hemoglobin  and hematocrit 12.6 and 37. A noncontrast CT is personally reviewed, as is the scout film. Question of a volvulus.   ASSESSMENT/PLAN: Question sigmoid volvulus. The patient is essentially nontender and minimally symptomatic at this point. Gastroenterology has been consulted, and I will await Dr. Earnest ConroyElliott's findings concerning sigmoid endoscopy and then proceed with either surgery or additional testing, depending on the results and indications. This has been discussed with the family as well as her physicians.  ____________________________ Adah Salvageichard E. Excell Seltzerooper, MD rec:cbb D: 12/08/2011 13:08:45 ET T: 12/08/2011 13:46:13 ET JOB#: [id number]  cc: Adah Salvageichard E. Excell Seltzerooper, MD, <Dictator> Lattie HawICHARD E Deni Berti MD ELECTRONICALLY SIGNED 12/08/2011 14:14

## 2014-07-19 NOTE — Consult Note (Signed)
PATIENT NAME:  Rhonda Webb, Rhonda Webb MR#:  [id number] DATE OF BIRTH:  December 14, 1912  DATE OF CONSULTATION:  12/07/2011  REFERRING PHYSICIAN:   CONSULTING PHYSICIAN:  Scot Junobert T. Elliott, MD  HISTORY OF PRESENT ILLNESS: The patient is a 81 year old white female known to me who presents with abdominal pain, distention, and a CAT scan suggesting a sigmoid volvulus. She has moved her bowels today. She has some abdominal discomfort, not severe pain. She came to the ER and because of abdominal pain a CAT scan was done and this showed a questionable sigmoid volvulus. I was asked to see the patient in consultation. The patient is known to me, previously seen in consultation. Her last hospital admission was in January of this year. She had a displaced left femoral neck fracture and this was operated on with a hip prosthesis and she did well with that.   PAST MEDICAL HISTORY:  1. Glaucoma. 2. Osteoporosis.  3. Anemia.  4. Hypertension.  5. Chronic back pain. 6. Gastroesophageal reflux disease.  PAST SURGICAL HISTORY:  1. Cataract surgery. 2. Hip surgery.  No abdominal surgery previously.   MEDICATIONS BASED ON PREVIOUS ADMISSION: 1. Protonix 40 mg a day.  2. Meclizine 12.5 mg b.i.d.  3. Lisinopril 5 mg a day.  REVIEW OF SYSTEMS: No vomiting. No rectal bleeding. She does have recurrent urinary tract infection. No chest pains. No significant shortness of breath. The patient is awake, alert, oriented, and recognizes me immediately.   PHYSICAL EXAMINATION:   VITAL SIGNS: Temperature 98, pulse 92, blood pressure 116/74.  GENERAL: Elderly white female in no acute distress.   HEENT: Head is atraumatic. Sclerae nonicteric. Conjunctivae negative.   NECK: Trachea is in the midline.   CHEST: Clear.   HEART: Regular rate and rhythm   ABDOMEN: Bowel sounds are present. The abdomen is distended in the mid upper abdomen without distention in the lower left abdomen and feels there is a very definite cutoff  there.   EXTREMITIES: No edema.   LABORATORY, DIAGNOSTIC, AND RADIOLOGICAL DATA: Glucose 112, BUN 17, creatinine 0.84, sodium 130, potassium 4.2, chloride 98, CO2 25, lipase 47, total protein 8.1, albumin 3.3, total bilirubin 0.8, alkaline phosphatase 107, SGOT 33, SGPT 32. Troponin is negative. White count 10.4, hemoglobin 12.6, platelet count 327. Urinalysis shows 2+ leukocyte esterase, 28 white cells per high-powered field.   CAT scan shows a small amount of distal small bowel lying in the pelvis. Urinary bladder decompressed with a Foley. Moderate distention of the colon with gas throughout its course. There is a transition zone in the mid sigmoid colon. Images are somewhat degraded by metal prosthesis from the hip and there is mild thickening of the distal sigmoid and proximal rectum.   ASSESSMENT: There is a  transition zone seen in the mid sigmoid. This may be mechanical such as a volvulus or reflect inflammatory changes, less likely neoplasm.        I think she has a volvulus present. Plan is to do Fleet's enemas and do a partial colonoscopy under IV sedation.   ____________________________ Scot Junobert T. Elliott, MD rte:drc D: 12/07/2011 17:34:39 ET T: 12/08/2011 07:37:17 ET JOB#: [id number]  cc: Scot Junobert T. Elliott, MD, <Dictator> Scot JunOBERT T ELLIOTT MD ELECTRONICALLY SIGNED 12/10/2011 13:59

## 2014-07-19 NOTE — H&P (Signed)
PATIENT NAME:  Rhonda Webb, Rhonda Webb MR#:  [id number] DATE OF BIRTH:  May 11, 1912  DATE OF ADMISSION:  12/07/2011  PRIMARY CARE PHYSICIAN: Zena Amos, MD REFERRING PHYSICIAN:  Dr. Enedina Finner.   CHIEF COMPLAINT: Abdominal pain, nausea, vomiting, diarrhea for 1 week.   HISTORY OF PRESENT ILLNESS: The patient is a 81 year old Caucasian female with a history of hypertension, chronic anemia, osteoporosis, chronic back pain, GERD, presented to the ED with the above chief complaint. Patient is alert, awake, oriented. According to her and her son, the patient has had abdominal pain, nausea, vomiting, diarrhea for the past 1 week. Abdominal pain is in the middle area, intermittent, nonradiating, 5 to 6 out of 10 associated with nausea, vomiting.  Patient vomited multiple times, cannot eat. In addition, the patient had diarrhea several times but no melena or bloody stool. The patient also has generalized weakness and cough but no fever or chills. Patient denies any shortness of breath, wheezing. No leg edema. The patient had a CAT scan of abdomen and pelvis, questionable sigmoid volvulus.  Dr. Excell Seltzer saw the patient in the ED and suggested the patient may have ileus, needs observation but no surgery at this time. He will review CAT scan with the radiologist.   PAST MEDICAL HISTORY: GERD, chronic back pain, hypertension, chronic anemia, osteoporosis, glaucoma, displaced left femoral neck fracture this January, status post femoral head replacement.   SOCIAL HISTORY: Living in assisted living. No smoking, alcohol, drinking, or illicit drugs.   PAST SURGICAL HISTORY: Cataract surgery and left femoral head replacement.   FAMILY HISTORY: A brother and mother had coronary artery disease.   ALLERGIES: Aspirin.   MEDICATIONS:  1. Brimonidine 0.15 ophthalmic solution 1 drop in both eyes twice a day. 2. Cipro 500 mg p.o. b.i.d. for 7 days.  3. Fexofenadine 180 mg p.o. daily.  4. Latanoprost 0.005% ophthalmic solution, 1  drop both eyes at bedtime.  5. Losartan 25 mg p.o. daily.  6. Mapap 325 mg p.o. b.i.d. every four hours p.r.n. for pain.  7. Lopressor 25 mg p.o. tablet 0.5 tablets every 12 hours.  8. Pantoprazole 40 mg p.o. daily.  9. Phenergan 1 tablet 12.5 mg p.o. t.i.d. p.r.n. for nausea.  10. Senokot 50 mg/8.6 mg p.o. b.i.d.  11. Vitamin D3 at 1000 international units tablets 2 tablets once daily.   REVIEW OF SYSTEMS. CONSTITUTIONAL: Patient denies any fever or chills but has a headache, mild dizziness, and weight loss recently. EYES: No double vision, blurred vision, but has a cataract. ENT: No epistaxis, postnasal drip, slurred speech, or dysphagia but has hearing loss. RESPIRATORY: Positive for cough but no shortness of breath, wheezing, or hematemesis. CARDIOVASCULAR: No chest pain, palpitation, orthopnea, or nocturnal dyspnea. No leg edema. GASTROINTESTINAL: Positive for nausea, vomiting, and diarrhea, also abdominal pain but no melena or bloody stool. GU: No dysuria, hematuria but has had urine incontinence.  ENDOCRINE: No polyuria, polydipsia, heat or cold intolerance. HEMATOLOGY: No easy bleeding or bleeding. NEUROLOGY: No syncope, loss of consciousness, or seizure.   PHYSICAL EXAMINATION:  VITAL SIGNS: Temperature 95.5, blood pressure initially 191/91 then decreased to 146/67, pulse 78, respirations 18, O2 saturation 95% in room air.   GENERAL: The patient is alert, awake, oriented, in no acute distress.   HEENT: Pupils are round, equal, reactive to light and accommodation. Dry oral mucosa. Clear oropharynx.   NECK: Supple. No JVD or carotid bruit. No lymphadenopathy. No pain. No thyromegaly.   CARDIOVASCULAR: S1, S2 regular rate, rhythm. No murmurs, gallops.  PULMONARY: Bilateral air entry. No wheezing or rales. No use of accessory muscles to breathe.   ABDOMEN: Soft. No distention. Bowel sounds present. Has some mild diffuse tenderness in the middle area. No rebound. No rigidity.    EXTREMITIES: No edema, clubbing, or cyanosis. No calf tenderness. Strong bilateral pedal pulses.   SKIN: No rash, jaundice, or bruises.   NEUROLOGIC: Alert and oriented x3. No focal deficit. Power 5 out of 5. Sensation intact. Deep tendon reflexes mute.   STUDIES: CAT scan of abdomen and pelvis shows gas distention of the colon but not the small bowel.  A transition zone in seen in the mid sigmoid.  This may be mechanical such as from a sigmoid volvulus or reflect inflammatory changes or less likely neoplasm.   LABORATORIES: Urinalysis shows WBC 28, RBCs 7, nitrite negative. Glucose 112, BUN 17, creatinine 0.84, sodium 130, potassium 4.2, chloride 98, bicarbonate 25. WBC 10, hemoglobin 12.6, platelets 327,000. Lipase 47, troponin less than 0.02.     IMPRESSION:  1. Questionable ileus.    2. Urinary tract infection.  3. Hyponatremia.  4. Accelerated hypertension.   PLAN OF TREATMENT:  1. The patient will be admitted to medical floor. We will keep n.p.o. except medications. We will start D5 normal saline and follow up BMP.  2. For urinary tract infection we will start Rocephin and follow up a urine culture.  3. Hypertension. We will continue losartan, Lopressor, and add hydralazine IV p.r.n.  4. GI and deep vein thrombosis prophylaxis.  5. Follow up with Dr. Excell Seltzerooper for further recommendations.  6. Discussed the patient's situation and plan of treatment with the patient and the patient's son.   TIME SPENT: About 60 minutes.    ____________________________ Shaune PollackQing Ashira Kirsten, MD qc:vtd D: 12/07/2011 15:53:00 ET   T: 12/08/2011 06:01:07 ET   JOB#: [id number] cc: Naomie DeanWilliam K. Kephart, MD Shaune PollackQing Alexius Ellington, MD, <Dictator> Shaune PollackQING Avyukt Cimo MD ELECTRONICALLY SIGNED 12/11/2011 17:07

## 2014-07-22 NOTE — Discharge Summary (Signed)
PATIENT NAME:  Rhonda Webb, Rhonda Webb DATE OF BIRTH:  September 29, 1912  DATE OF ADMISSION:  09/15/2012 DATE OF DISCHARGE:  09/17/2012  ADMISSION DIAGNOSIS: Nausea and vomiting.   DISCHARGE DIAGNOSES: 1.  Benign paroxysmal positional vertigo causing nausea.  2.  Hyponatremia.  3.  Hypertension.  4.  Urinary tract infection.  CONSULTANTS: Physical therapy.   LABORATORY AND DIAGNOSTICS:  Discharge sodium 131, potassium 3.4, chloride 96, bicarb 30, BUN 10, creatinine 0.83, glucose 85.   MRI of the brain showed no acute findings, no acute infarct.   CT of the head showed no acute intracranial hemorrhage or CVA.   CT of the abdomen showed no evidence of acute abdominal pathology.   HOSPITAL COURSE:  A 81 year old female who presented with nausea and vomiting. For further details, please refer to the H and P.  1.  Nausea and vomiting. This is suspected initially to be due to a viral syndrome. He did have physical therapy assess the patient. They felt this is more inner ear pathology as she did have this diagnosed more BPPV. We did a MRI to rule out a CVA, which was negative. She actually has tolerated meclizine well and is doing well. She will need a follow-up ENT consult as well as outpatient physical therapy for Epley maneuver.  2.  Hyponatremia from dehydration and nausea, which has improved.  3.  Hypertension. Her blood pressure was high here so we increased her losartan. She will need outpatient follow-up. We have arranged a home health nurse and will follow up with her primary care physician.  4.  UTI. The patient was started on Rocephin. She will be discharged with Keflex. At discharge urine cultures were too small colonies to read.   DISCHARGE MEDICATIONS: 1.  Latanoprost 0.005% ophthalmic solution 1 drop to both eyes at bedtime.  2.  Allegra 180 mg daily.  3.  Vitamin D3 1000 international units 2 tablets daily.  4.  Brimonidine 0.15% ophthalmic solution b.i.d.  5.  Timoptic 50  mg b.i.d.  6.  PreserVision 1 tablet b.i.d.  7.  Tylenol 325 mg 2 tablets q. 4 hours p.r.n. pain.  8.  Docusate 100 mg b.i.d.  9.  Protonix 40 mg daily.  10.  Losartan 100 mg daily, which was increased from 50 mg daily.  11.  Meclizine 12.5 mg t.i.d. x 5 days.  12.  Keflex 500 mg t.i.d. for 5 days.   DISCHARGE HOME HEALTH: With physical therapy, nurse and nurse aide.  DISCHARGE DIET: Low sodium.   DISCHARGE ACTIVITY: As tolerated.   DISCHARGE FOLLOWUP:  The patient will follow up with ENT in 3 to 5 days as well as home health arranged for her, and she will need to see her primary care physician, Dr. Zena AmosWilliam Webb, in 2 to 4 weeks.   The patient is medically stable for discharge.  TIME SPENT: Approximately 35 minutes.  ____________________________ Janyth ContesSital P. Juliene PinaMody, MD spm:sb D: 09/17/2012 12:08:18 ET T: 09/17/2012 12:20:18 ET JOB#: [id number]  cc: Rhonda Denise P. Juliene PinaMody, MD, <Dictator> Rhonda DeanWilliam K. Kephart, MD Janyth ContesSITAL P Larue Drawdy MD ELECTRONICALLY SIGNED 09/17/2012 13:11

## 2014-07-22 NOTE — H&P (Signed)
PATIENT NAME:  Rhonda Webb, CONSOLO MR#:  [id number] DATE OF BIRTH:  19-Aug-1912  DATE OF ADMISSION:  09/15/2012  PRIMARY CARE PHYSICIAN: Zena Amos, MD  CHIEF COMPLAINT: Nausea.   HISTORY OF PRESENT ILLNESS:  This is a 81 year old female with a history of colostomy and chronic anemia who presents with nausea.  According to the patient, she has been having nausea for the past few days with decreased p.o. appetite. She is feeling dizzy because she is not eating She denies any abdominal pain, diarrhea. A CT of the abdomen was performed down in the ER which showed no acute obstruction.   REVIEW OF SYSTEMS: CONSTITUTIONAL: No fever. Positive fatigue, weakness. EYES:  No blurred or double vision. Positive glaucoma and cataracts.  ENT: Positive hearing loss. No snoring or postnasal drip.  RESPIRATORY:  No cough, wheezing, hemoptysis or COPD. CARDIOVASCULAR:  No chest pain or palpitations, orthopnea, syncope, edema, arrhythmia or dyspnea near syncope.  GASTROINTESTINAL: Positive nausea and vomiting. No diarrhea, abdominal pain, melena or ulcers. GENITOURINARY:  No dysuria or hematuria.  ENDOCRINE: No polyuria or polydipsia. HEME AND LYMPH:  Positive anemia. Positive easy bruising.  SKIN: No rash or lesions. MUSCULOSKELETAL:  Some limited activity due to age and weakness.  NEUROLOGIC: No history of CVA, TIA or seizures. PSYCHIATRIC: No history of anxiety or depression.   PAST MEDICAL HISTORY: 1.  Colitis/proctitis.  2.  Bowel obstruction leading to colostomy.  3.  Chronic anemia.  4.  Gastrointestinal bleed. 5.  GERD.  6.  Glaucoma and cataracts. 7.  Chronic back pain. 8.  Hypertension.  PAST SURGICAL HISTORY: 1.  Colostomy.  2.  Left hip fracture.   MEDICATIONS:  1.  Zofran ODT 1 tablet every 4 hours.  2.  Vitamin D3 1000 international units 2 tablets daily.  3.  Tramadol 50 mg q. 6 hours p.r.n.  4.  Protonix 40 mg daily.  5.  PreserVision 1 tablet b.i.d.  6.  Tylenol 650 mg  b.i.d.  7.  Losartan 50 mg daily.  8.  Latanoprost 0.005% one drop to both eyes daily.  9.  Allegra 180 mg daily.  10.  Docusate 100 mg b.i.d.  11.  Brimonidine 0.15% one drop in both eyes.   ALLERGIES: ASPIRIN CAUSES BLEEDING.   FAMILY HISTORY: Unknown.   SOCIAL HISTORY: The patient is a resident at [facility].  No tobacco, alcohol or drug use.   PHYSICAL EXAMINATION: VITAL SIGNS: Temperature 98.2, pulse 78, respirations 18, blood pressure 149/74, 93% on room air.  GENERAL: The patient is alert and oriented, not in acute distress.  HEENT: Head is atraumatic. She has cataracts although her pupils are 2 mm, sluggish to light.  Oropharynx is clear without any exudates. Mucous membranes are dry and pink.  NECK: Supple with no JVD, carotid bruit or enlarged thyroid. HEART:  Regular rate and rhythm. No murmurs, gallops or rubs. PMI is not displaced.  LUNGS: Clear to auscultation without crackles, rales, rhonchi or wheezing. Normal to percussion.  ABDOMEN: Bowel sounds are positive. Nontender and nondistended. No hepatosplenomegaly. She has a colostomy bag.  EXTREMITIES: No clubbing, cyanosis or edema.  NEUROLOGIC:  Cranial nerves II through XII are grossly intact. No focal deficits. Motor strength is 4/5 bilaterally and symmetrically.  SKIN: Without any rash or lesions.  LABORATORY AND DIAGNOSTICS:  CT of the abdomen and pelvis showed no bowel obstruction or perforation. CT of the head shows atrophy, but no acute changes.   White blood cells 10.2, hemoglobin 12, hematocrit 35  and platelets 310.  Sodium 128, potassium 4.1, chloride 94, bicarb 27, BUN 12, creatinine 0.65, glucose 126.  Lipase 41. CK 97 and CPK-MB 1.3. Troponin 0.03.   ASSESSMENT AND PLAN: A 81 year old female who presents with nausea and vomiting with nonacute CT scan.  1.  Nausea and vomiting, likely secondary to viral illness. The patient's CT of the abdomen shows no obstruction. We will continue supportive care including  antiemetics, IV fluids and clear liquid diet, advance as tolerated.  2.  History of hypertension. Continue losartan.  3.  History of glaucoma. Continue eye drops.  4.  History of gastroesophageal reflux disease. Continue proton pump inhibitor.  65.  Chronic back pain. Continue Tylenol.   The patient is DNR status.   TIME SPENT ON ADMISSION: Approximately 45 minutes. ____________________________ Janyth ContesSital P. Juliene PinaMody, MD spm:sb D: 09/15/2012 12:02:38 ET T: 09/15/2012 12:49:35 ET JOB#: [id number]  cc: Samanvitha Germany P. Juliene PinaMody, MD, <Dictator> Naomie DeanWilliam K. Kephart, MD Janyth ContesSITAL P Nautika Cressey MD ELECTRONICALLY SIGNED 09/15/2012 15:15

## 2014-07-23 NOTE — H&P (Signed)
PATIENT NAME:  Rhonda Webb, Rhonda Webb MR#:  [id number] DATE OF BIRTH:  [birth year]  DATE OF ADMISSION:  12/29/2013  REFERRING PHYSICIAN: Charlestine Night. Scotty Court, MD   PRIMARY CARE DOCTOR: Velta Addison, MD  ADMIT DIAGNOSIS: Altered mental status.   HISTORY OF PRESENT ILLNESS: This is a 81 year old Caucasian female who presents to the Emergency Department via EMS. It is unclear who called them or where the patient currently lives but they report having to suction thick purulent sputum from her mouth. The patient arrived in the Emergency Department and was tachycardic and tachypneic which prompted the Emergency Department staff to place the patient on Ventimask with 100% 02. It is unclear how altered the patient was at the time she arrived, but on my physical exam she was slightly communicative. She knew her birthday but could not explain to me the onset of symptoms nor whether she was in any acute pain. Due to these reasons, the Emergency Department staff called for admission.   REVIEW OF SYSTEMS: Unobtainable as the patient does not verbally respond to many of my questions.   PAST MEDICAL HISTORY: Hypertension, glaucoma, anemia, GERD, chronic back pain, history of GI bleed, colitis.   SURGICAL HISTORY: Colostomy placement and small bowel resection.  She has had a left hip replacement as well.    FAMILY HISTORY: Not available at this time.   SOCIAL HISTORY: Also not available at this time.    MEDICATIONS:  1.  Artificial tears ophthalmic drops one drop to each eye 4 times a day as needed for dryness.  2.  Brimonidine ophthalmic drops 0.2% one drop to each eye 2 times a day.  3.  Docusate sodium 100 mg 1 capsule p.o. every 12 hours as needed for constipation.  4.  Eucerin cream apply topically to affected areas once a day.  5.  Fexofenadine 60 mg 1 tablet p.o. daily.  6.  Latanoprost ophthalmic drops 0.005% solution 1 drop to each affected eye at bedtime. 7.  Tylenol 650 mg extended release tablet 1  tab p.o. b.i.d.  8.  Meclizine 12.5 mg 1 tablet p.o. every 8 hours as needed for dizziness.  9.  Ondansetron 4 mg 1 tablet p.o. every 8 hours as needed for nausea or vomiting.  10.  Pantoprazole 40 mg 1 tab p.o. daily.  11.  Polyethylene glycol oral solution 1 capsule or 17 grams orally every day as needed for constipation.  12.  PreserVision AREDS antioxidant multivitamin one cap p.o. b.i.d.  13.  Robafen DM 10 mg -- 100 mg per 5 mL, 10 mL p.o. every 6 hours as needed for cough.  14.  Vitamin D3 1000 international units 1 tablet p.o. b.i.d.   ALLERGIES: ASPIRIN.   PERTINENT LABORATORY RESULTS AND RADIOGRAPHIC FINDINGS: Serum glucose is 126, BUN 13, creatinine 0.69, sodium 129, potassium is 4.1, chloride 95, CO2 is 27, calcium is 8.9, phosphorus is 2.7, magnesium is 1.3, AST 31, ALT 33, alkaline phosphatase is 128, serum albumin is 3.1. Troponin is negative. White blood cell count is 10.5, hemoglobin 12.3, and hematocrit 37.8. INR is 1.1. Urinalysis is negative for infection. ABG shows a pH of 7.46, pCO2 35, pO2 of 73 with a base excess of 1.4 on 32% FiO2. Lactic acid is 0.7. Chest x-ray shows bibasilar atelectasis, cardiomegaly and osteopenia.   PHYSICAL EXAMINATION:  VITAL SIGNS: Temperature is 98.1, pulse was originally 105, respirations 32, blood pressure 179/98, pulse oximetry 91% on room air which improved to 100% on Venturi mask.  GENERAL:  The patient is sleepy but arousable. She is oriented to person and place. She is not in any apparent distress.  HEENT: Normocephalic, atraumatic. Pupils equal, round, and reactive to light and accommodation. Extraocular movements are intact. Mucous membranes are tacky.  NECK: Trachea is midline. No adenopathy.  CHEST: Symmetric and atraumatic.  CARDIOVASCULAR: Regular rate and rhythm. Normal S1, S2. No rubs, clicks, or murmurs appreciated.  LUNGS: Clear to auscultation bilaterally. Normal effort and excursion.  ABDOMEN: Positive bowel sounds. Soft,  nontender, nondistended. No hepatosplenomegaly. Colostomy is in place. The stoma is clean and dry.  GENITOURINARY: Normal external female genitalia.  MUSCULOSKELETAL: The patient did not participate in strength exams, but she does clearly move both upper extremities, and I observed her move her right lower leg.  SKIN: No rashes or lesions. I was not able to roll the patient to check her back and buttocks.  NEUROLOGIC: Cranial nerves II through XII are grossly intact.  PSYCHIATRIC: Mood is not assessable as the patient is very somnolent, hard of hearing and is not entirely cooperative with me.   ASSESSMENT AND PLAN: This is a 81 year old admitted for altered mental status.  1.  Altered mental status is likely secondary to dementia, hyponatremia and hyperventilation. The patient is barely alkalotic  but was obviously very tachypneic when she arrived. Her pCO2 is normal now, but she is on Ventimask. The report of thick purulent mucus suctioned by EMS could simply be due to poor oral care as there is no evidence of pneumonia on chest x-ray. The patient also has no leukocytosis and her tachypnea is progressively improving. The ED did give one dose of ceftriaxone. We will not continue this unless there is further evidence the patient's respiratory status is going to decline. We will check a TSH and RPR for reversible causes of altered mental status.  2.  Hyponatremia, probably hypovolemic. I have started the patient on IV maintenance fluid and we will continue to follow her sodium.  3.  Hypertension. Blood pressure is elevated but it is borderline acceptable for age. We will continue her home medications.  4.  Glaucoma. Continue the patient's eyedrops per her home regimen.  5.  Gastroesophageal reflux disease. We will continue pantoprazole.  6.  Deep vein thrombosis prophylaxis, sequential compression devices.  7.  Gastrointestinal prophylaxis. We will continue the patient's proton pump inhibitor as above.    CODE STATUS:  The patient is a do not resuscitate. She is a no code.   TIME SPENT ON ADMISSION ORDERS AND PATIENT CARE: Approximately 30 minutes.    ____________________________ Kelton PillarMichael S. Sheryle Hailiamond, MD msd:AT D: 12/29/2013 01:18:34 ET T: 12/29/2013 01:47:39 ET JOB#: [id number]  cc: Kelton PillarMichael S. Sheryle Hailiamond, MD, <Dictator> Kelton PillarMICHAEL S DIAMOND MD ELECTRONICALLY SIGNED 12/29/2013 7:16

## 2014-07-23 NOTE — Discharge Summary (Signed)
PATIENT NAME:  Rhonda Webb, Rhonda Webb MR#:  [id number] DATE OF BIRTH:  28-Oct-1912  DATE OF ADMISSION:  12/29/2013 DATE OF DISCHARGE:  01/01/2104  ADMITTING DIAGNOSES:  1.  Altered mental status.  2.  Respiratory distress.   DISCHARGE DIAGNOSES:  1. Acute encephalopathy related to acute respiratory failure.  2. Right peritracheal mass concerning for malignancy or metastatic disease.  3. Left lower lobe atelectasis with possible endobronchial lesion.  4. Acute respiratory failure due to tracheal lesion, possible lung lesion, not a candidate for any chemotherapy or any other further evaluation based on her advanced age, [facility] following there.  5. Hypertension.  6. Hyponatremia.  7. Gastroesophageal reflux disease.  8. Chronic back pain.  9. History of gastrointestinal bleed.  10. History of colitis.  11. Status post colostomy with small bowel  resection.  12. Status post left hip replacement.   PERTINENT LABORATORIES AND EVALUATIONS: Chest x-ray showed mild bibasilar atelectasis and cardiomegaly. EKG shows sinus tachycardia. Vitamin D levels were 43.8. INR 1.1. Magnesium was 1.3. Glucose 126, BUN 13, creatinine 0.69, sodium 129, potassium 4.1, chloride 95, CO2 27. LFTs showed albumin of 3.1. Blood cultures x 2 no growth. Urine cultures no growth. CT per PE protocol showed 4.9 x 3.1 cm peritracheal mass, complete atelectasis of the left lower lobe noted, possible mucus plugging or obstructing endobronchial lesion.   HOSPITAL COURSE: Please refer to H and P done by the admitting physician. The patient is a 81 year old white female who was brought in to the Emergency Room with acute altered mental status. She was also very hypoxic, had to be placed on a Ventimask. The patient was admitted and initially had a chest x-ray which was negative, however her hypoxia was unexplained, therefore she had a CT per PE which showed an endobronchial lesion as well as  atelectasis of the left lung. The patient due to her advanced age was not a candidate for any intervention. Discussions with the family were held and [facility] was [facility]   MEDICATIONS AT THE TIME OF DISCHARGE: Vitamin D3 1000 international units 2 tabs daily, Tylenol 650 mg 1 tab p.o. b.i.d. as needed, Protonix 40 daily, brimonidine 1 drop to each eye b.i.d., PreserVision 1 tab p.o. b.i.d., latanoprost ophthalmic 0.005% 1 drop to each affected eye at bedtime, fexofenadine 60 daily, meclizine 12.5 1 tab p.o. q. 8 p.r.n., Artificial Tears 1 drop to each eye 4 times a day, Zofran 4 mg q. 8 p.r.n., MiraLax 17 grams daily, Eucerin topical lotion affected area daily, Colace 100 mg 1 tab p.o. at bedtime, benzonatate 100 mg 1 tab p.o. q. 6 p.r.n. cough, phenol topically 2 sprays 2 hours as needed for sore throat, Levaquin 1 tab p.o. q. 48 hours x 6 days, oxygen 2 liters.   DIET: Low sodium with (Dictation Anomaly) liquids, strict aspiration precautions.   REFERRALS: Hospice to follow here there.    DISCHARGE FOLLOWUP: Follow with primary M.D. in 1-2 weeks.    TIME SPENT ON THIS DISCHARGE: 35 minutes.     ____________________________ Lacie ScottsShreyang H. Allena KatzPatel, MD shp:bu D: 12/31/2013 13:57:57 ET T: 12/31/2013 16:09:35 ET JOB#: [id number]  cc: Rhonda Webb H. Allena KatzPatel, MD, <Dictator> Rhonda CarwinSHREYANG H Corinthia Helmers MD ELECTRONICALLY SIGNED 01/09/2014 8:54

## 2014-07-24 NOTE — H&P (Signed)
PATIENT NAME:  Rhonda Webb, Rhonda Webb MR#:  [id number] DATE OF BIRTH:  1912/08/14  DATE OF ADMISSION:  04/15/2011  PRIMARY CARE PHYSICIAN: Dr. Henrene HawkingKephart EMERGENCY ROOM PHYSICIAN: Dr. Chiquita LothJade Sung  CHIEF COMPLAINT: Fall.   HISTORY OF PRESENT ILLNESS: Patient is a 81 year old female presents with chief complaint of dizziness. Patient tripped and fell. She complained of mild left hip pain. She was brought to the Emergency Department. X-ray showed left femoral neck fracture. She denies any chest pain, syncope, palpitations.   Per her family patient has been confused at the assisted living. Since yesterday she has not been able to communicate.   PAST MEDICAL HISTORY:  1. Glaucoma. 2. Osteoporosis. 3. Anemia. 4. Hypertension. 5. Chronic back pain. 6. Gastroesophageal reflux disease.   PAST SURGICAL HISTORY: Cataract surgery.     Medications: 1.Protonix 40 mg po daily. 2.meclizine 12.5 po bid. 3.lisinopril 5 mg po daily. 4.latanoprost 0.05% eah eye bid. 5.ferrous sulfate 325 mg po daily. 6.cal carb 600 mg po daily. 7.brimonide 0.15 % 1 drop each eye bid.   REVIEW OF SYSTEMS: CONSTITUTIONAL: Patient denies any fevers, chills, night sweats. HEENT: Patient denies any hearing loss, dysphagia, visual problems, sore throat. CARDIOVASCULAR: Patient denies any chest pain, orthopnea, PND. RESPIRATORY: Patient denies any cough, wheezing, hemoptysis. GASTROINTESTINAL: Patient denies any nausea, vomiting, abdominal pain, hematemesis, hematochezia, melena. GENITOURINARY: Patient denies any hematuria, dysuria, frequency. ENDOCRINE: Patient denies polyuria, polyphagia, polydipsia Patient denies any heat or cold intolerance. HEMATOLOGIC: Patient denies any easy bleeding or bruises. SKIN: Patient any lesions, rash. NEUROLOGIC: Patient denies any headache, focal weakness seizures. PSYCH: No anxiety, depression.   SOCIAL HISTORY: [facility] Has two sons. No history of tobacco or alcohol  abuse.   FAMILY HISTORY: Brother died, had a stroke and coronary artery disease when he was 4580. Both parents lived into late 3180s. Mother had coronary artery disease and father had hernia.   PHYSICAL EXAMINATION:  VITAL SIGNS: Temperature 97.6, heart rate 76, respiratory rate 18, blood pressure 202/89, oxygen sats 96%.   HEENT: Atraumatic, normocephalic. Pupils are equal, round, and reactive to light and accommodation. Extraocular movements are intact. Sclera anicteric. Mucous membranes moist.   NECK: Supple. No organomegaly.   CARDIOVASCULAR: S1, S2, regular rate, rhythm. No gallops. No thrills. No murmurs.   RESPIRATORY: Lungs are clear to auscultation. No rales, no rhonchi, no wheezes, no bronchial breath sounds.   GASTROINTESTINAL: Abdomen is soft, nontender, nondistended. Normal bowel sounds. No hepatosplenomegaly.   GENITOURINARY: There is no hematuria or masses noted.   SKIN: No lesions, no rash.   ENDOCRINE: No masses. No thyromegaly.   LYMPH: No lymphadenopathy or nodes palpable.   NEUROLOGIC: Cranial nerves II through XII grossly intact. Motor strength is 5/5 bilateral upper and lower extremities. Sensation within normal limits. No focal neurological deficits noted on the examination.   MUSCULOSKELETAL: No arthritis, joint effusion, swelling.   HEMATOLOGICAL: No ecchymosis, no bleeding, no myopathy.   EXTREMITIES: No cyanosis, no clubbing, no edema. 2+ pedal pulses noted bilaterally.   LABORATORY, DIAGNOSTIC AND RADIOLOGICAL DATA:  EKG sinus rhythm with first degree AV block, 73 beats per minute.   WBC count 7200, hemoglobin 10.9, hematocrit 32.5, platelet count 257, glucose 106, BUN 23, creatinine 0.86, sodium 140, potassium 4.0, chloride 103, CO2 28, calcium 9.1, total bilirubin 0.3, alkaline phosphatase 108, ALT 13, AST 18, total protein 7.7, albumin 3.4. Estimated GFR greater than 60. Troponin less than 0.02. Urinalysis shows 30 WBCs. WBC count 17,400, hemoglobin  10.7,  hematocrit 32.2, platelet count 248. Labs on 04/15/2011: Glucose 113, BUN 23, creatinine 0.91, sodium 140, potassium 4.4, chloride 100, CO2 30, calcium 9.0, total bilirubin 0.3, alkaline phosphatase 94, ALT 22, AST 26, total protein 7.9, albumin 2.5. Estimated GFR greater than 60. PT 13.8, INR 1.0.   ASSESSMENT AND PLAN:  1. Patient is a 81 year old female presents after a fall. She has a urinary tract infection. Start IV Rocephin. Check urine cultures.  2. Hypertension. Continue lisinopril.  3. Glaucoma. Continue brimonidine, latanoprost.    ____________________________ Donia Ast, MD jsp:cms D: 04/16/2011 02:21:20 ET T: 04/16/2011 08:29:30 ET JOB#: [id number]  cc: Donia Ast, MD, <Dictator> Naomie Dean, MD Donia Ast MD ELECTRONICALLY SIGNED 04/16/2011 21:47

## 2014-07-24 NOTE — Discharge Summary (Signed)
PATIENT NAME:  Rhonda Webb, [name] MR#:  [id number] DATE OF BIRTH:  [birth year]  DATE OF ADMISSION:  04/15/2011 DATE OF DISCHARGE:  04/22/2011  DISCHARGE DIAGNOSES:  1. Displaced left femoral neck fracture.  2. Glaucoma.  3. Osteoporosis.  4. Chronic anemia.  5. Hypertension.  6. Chronic back pain.  7. Gastroesophageal reflux disease.   OPERATIONS/PROCEDURES PERFORMED: Prosthetic left femoral head replacement with Moore prosthesis on 04/17/2011.   HISTORY AND PHYSICAL EXAMINATION: As dictated on admission.   LABORATORY DATA: As noted in the chart.   COURSE IN HOSPITAL: The patient was seen by [facility] and cleared for the proposed hip surgery. After thorough informed consent with the patient's family, the patient was taken to the operating room on 04/17/2011 where prosthetic femoral head replacement with a Moore prosthesis was performed. The patient tolerated the procedure quite well. Postoperatively she was anemic and was transfused 1 unit of packed red blood cells. She was advanced up into the chair and physical therapy full weightbearing. By 04/22/2011, it was felt that [facility].   ORDERS: Her orders are as on the printed order sheet.   DISCHARGE INSTRUCTIONS: 1. She is to use nasal oxygen.  2. Total hip precautions should be used at all times.  3. Please change her left elbow dressing as necessary and also her left hip dressing.    4. Please remove her staples in 10 days if her wound is seen to be healing in well.  5. Please discontinue her Lovenox in seven days.  ____________________________ Clare Gandyhristopher E. Domingos Riggi, MD ces:drc D: 04/22/2011 13:18:41 ET T: 04/22/2011 13:31:30 ET JOB#: [id number]  cc: Clare Gandyhristopher E. Broox Lonigro, MD, <Dictator> Clare GandyHRISTOPHER E Lidiya Reise MD ELECTRONICALLY SIGNED 04/25/2011 13:01

## 2014-07-24 NOTE — Consult Note (Signed)
Brief Consult Note: Diagnosis: htn.   Comments: 1.uti- start iv rocpehin check urine cultures.  2.htn- continue lisinopril.  3.glacoma- brimodine, lantoprost.  Electronic Signatures: Donia AstPatel, Quetzal Meany S (MD)  (Signed 15-Jan-13 02:09)  Authored: Brief Consult Note   Last Updated: 15-Jan-13 02:09 by Donia AstPatel, Seda Kronberg S (MD)

## 2014-07-24 NOTE — Op Note (Signed)
PATIENT NAME:  Rhonda Webb, [name] MR#:  [id number] DATE OF BIRTH:  [birth year]  DATE OF PROCEDURE:  04/17/2011  PREOPERATIVE DIAGNOSIS: Displaced femoral neck fracture, left hip.   POSTOPERATIVE DIAGNOSIS: Displaced femoral neck fracture, left hip.   PROCEDURE: Prosthetic left femoral head replacement with AT Moore prosthesis.   SURGEON: Clare Gandyhristopher E. Jerlene Rockers, MD    ANESTHESIA: Spinal.   COMPLICATIONS: None.   ESTIMATED BLOOD LOSS: 250 mL.   PROCEDURE: One gram of Ancef was given intravenously prior to the procedure. Spinal anesthesia is induced. The patient is secured in the right lateral decubitus position in the usual manner for left hip surgery. The left hip and leg are thoroughly prepped with alcohol and ChloraPrep and draped in standard sterile fashion. Standard posterolateral incision is made and the dissection carried down to the fascia lata which is incised in the line of the skin incision. Charnley retractor is placed. Trochanteric bursa is incised. The sciatic nerve is palpated deep within the wound and protected throughout the case. The piriformis tendon is identified, cut, and tagged. The remainder of the external rotators are incised with the Bovie. The periosteal elevator is used to completely reveal the posterior capsule. The posterior capsule is then cut off directly and incised in a T fashion and the ends are tagged and preserved. The femoral neck is cut off at the appropriate angle and length. The femoral head is removed and measured at 45 mm in diameter. The acetabulum is irrigated multiple times and removed of any present debris. The proximal femur is then reamed for a standard AT Moore prosthesis. Wound is thoroughly irrigated multiple times again. The 45 mm standard AT Christell ConstantMoore prosthesis is then impacted into place and is seen to be an excellent solid fit. The hip is then reduced and is seen to be stable on range of motion with equal leg lengths. Wound is thoroughly irrigated  multiple times again. Posterior capsule is repaired with #2 Ethibond and re-secured to the abductor mechanism. The piriformis tendon is re-secured to the abductor mechanism. The fascia lata is closed with 0 Ethibond and the subcutaneous tissue closed with 2-0 Vicryl. Skin is closed with the skin stapler. Soft bulky dressing is applied. The patient is carefully turned over onto the hospital bed and secured in the abduction pillow and returned to the recovery room in satisfactory condition having tolerated the procedure quite well.   ____________________________ Clare Gandyhristopher E. Shawn Dannenberg, MD ces:drc D: 04/18/2011 08:11:04 ET T: 04/18/2011 10:37:13 ET JOB#: [id number]  cc: Clare Gandyhristopher E. Kirstan Fentress, MD, <Dictator> Clare GandyHRISTOPHER E Jontue Crumpacker MD ELECTRONICALLY SIGNED 04/18/2011 13:39
